# Patient Record
Sex: Male | Born: 1980 | Race: White | Hispanic: No | Smoking: Former smoker
Health system: Southern US, Community
[De-identification: ages and names within clinical notes are randomized; demographics above are authoritative.]

---

## 2009-01-20 ENCOUNTER — Emergency Department: Payer: Self-pay | Admitting: Internal Medicine

## 2014-12-20 ENCOUNTER — Emergency Department (HOSPITAL_BASED_OUTPATIENT_CLINIC_OR_DEPARTMENT_OTHER)

## 2014-12-20 ENCOUNTER — Encounter (HOSPITAL_BASED_OUTPATIENT_CLINIC_OR_DEPARTMENT_OTHER): Payer: Self-pay

## 2014-12-20 ENCOUNTER — Emergency Department (HOSPITAL_BASED_OUTPATIENT_CLINIC_OR_DEPARTMENT_OTHER)
Admission: EM | Admit: 2014-12-20 | Discharge: 2014-12-20 | Disposition: A | Discharge status: Home / Self Care | Attending: Emergency Medicine | Admitting: Emergency Medicine

## 2014-12-20 DIAGNOSIS — Y9389 Activity, other specified: Secondary | ICD-10-CM | POA: Diagnosis not present

## 2014-12-20 DIAGNOSIS — S022XXA Fracture of nasal bones, initial encounter for closed fracture: Secondary | ICD-10-CM | POA: Diagnosis not present

## 2014-12-20 DIAGNOSIS — Z87891 Personal history of nicotine dependence: Secondary | ICD-10-CM | POA: Insufficient documentation

## 2014-12-20 DIAGNOSIS — Y998 Other external cause status: Secondary | ICD-10-CM | POA: Diagnosis not present

## 2014-12-20 DIAGNOSIS — Y9289 Other specified places as the place of occurrence of the external cause: Secondary | ICD-10-CM | POA: Insufficient documentation

## 2014-12-20 DIAGNOSIS — S0181XA Laceration without foreign body of other part of head, initial encounter: Secondary | ICD-10-CM

## 2014-12-20 DIAGNOSIS — S0992XA Unspecified injury of nose, initial encounter: Secondary | ICD-10-CM | POA: Diagnosis present

## 2014-12-20 MED ORDER — KETOROLAC TROMETHAMINE 60 MG/2ML IM SOLN
60.0000 mg | Freq: Once | INTRAMUSCULAR | Status: AC
  Administered 2014-12-20: 60 mg via INTRAMUSCULAR
  Filled 2014-12-20: qty 2

## 2014-12-20 MED ORDER — AMOXICILLIN-POT CLAVULANATE 875-125 MG PO TABS
1.0000 | ORAL_TABLET | Freq: Once | ORAL | Status: AC
  Administered 2014-12-20: 1 via ORAL
  Filled 2014-12-20: qty 1

## 2014-12-20 MED ORDER — AMOXICILLIN-POT CLAVULANATE 875-125 MG PO TABS: 1.0000 | ORAL_TABLET | Freq: Two times a day (BID) | ORAL | Status: AC

## 2014-12-20 MED ORDER — LIDOCAINE-EPINEPHRINE 1 %-1:100000 IJ SOLN
10.0000 mL | Freq: Once | INTRAMUSCULAR | Status: AC
  Administered 2014-12-20: 10 mL
  Filled 2014-12-20: qty 1
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
## 2014-12-20 NOTE — Discharge Instructions (Signed)
Take Augmentin as directed until gone. Follow up with Dr. Emeline DarlingGore for further evaluation regarding your nasal bone fracture. Have your stitches removed in 5 days. Refer to attached documents for more information.
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
